# Patient Record
Sex: Male | Born: 1995 | Race: White | Hispanic: No | Marital: Single | Smoking: Never smoker
Health system: Southern US, Community
[De-identification: ages and names within clinical notes are randomized; demographics above are authoritative.]

## PROBLEM LIST (undated history)

## (undated) HISTORY — PX: CYST EXCISION: SHX5701

---

## 2014-06-14 ENCOUNTER — Encounter: Payer: Self-pay | Admitting: General Practice

## 2014-06-14 ENCOUNTER — Emergency Department: Payer: PRIVATE HEALTH INSURANCE

## 2014-06-14 ENCOUNTER — Emergency Department
Admission: EM | Admit: 2014-06-14 | Discharge: 2014-06-14 | Disposition: A | Discharge status: Home / Self Care | Payer: PRIVATE HEALTH INSURANCE | Attending: Emergency Medicine | Admitting: Emergency Medicine

## 2014-06-14 DIAGNOSIS — R51 Headache: Secondary | ICD-10-CM | POA: Diagnosis present

## 2014-06-14 DIAGNOSIS — G8929 Other chronic pain: Secondary | ICD-10-CM | POA: Diagnosis not present

## 2014-06-14 DIAGNOSIS — R42 Dizziness and giddiness: Secondary | ICD-10-CM | POA: Insufficient documentation

## 2014-06-14 MED ORDER — HYDROCODONE-ACETAMINOPHEN 5-325 MG PO TABS: 1.0000 | ORAL_TABLET | ORAL | Status: AC | PRN

## 2014-06-14 NOTE — Discharge Instructions (Signed)
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   Also for follow up with Chronic headaches
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
## 2014-06-14 NOTE — ED Notes (Signed)
States he is having intermittent headaches for a while  Denies any trauma fever vision changes or trauma
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
## 2021-03-31 ENCOUNTER — Ambulatory Visit
Admission: EM | Admit: 2021-03-31 | Discharge: 2021-03-31 | Disposition: A | Discharge status: Home / Self Care | Payer: Managed Care, Other (non HMO) | Attending: Internal Medicine | Admitting: Internal Medicine

## 2021-03-31 ENCOUNTER — Other Ambulatory Visit: Payer: Self-pay

## 2021-03-31 ENCOUNTER — Encounter: Payer: Self-pay | Admitting: Emergency Medicine

## 2021-03-31 DIAGNOSIS — J039 Acute tonsillitis, unspecified: Secondary | ICD-10-CM | POA: Diagnosis not present

## 2021-03-31 DIAGNOSIS — J029 Acute pharyngitis, unspecified: Secondary | ICD-10-CM

## 2021-03-31 MED ORDER — AMOXICILLIN-POT CLAVULANATE 875-125 MG PO TABS: 1.0000 | ORAL_TABLET | Freq: Two times a day (BID) | ORAL | 0 refills | Status: AC

## 2021-03-31 MED ORDER — PREDNISONE 10 MG (21) PO TBPK: ORAL_TABLET | Freq: Every day | ORAL | 0 refills | Status: AC

## 2021-03-31 NOTE — ED Triage Notes (Addendum)
Says he had strep in November, then he's gotten sick with a sore throat every week since then. States it'll make him hoarse. This episode started two days ago ?

## 2021-03-31 NOTE — ED Provider Notes (Signed)
?[facility] ? ? ? ?CSN: [id number] ?Arrival date & time: 03/31/21  1718 ? ? ?  ? ?History   ?Chief Complaint ?Chief Complaint  ?Patient presents with  ? Sore Throat  ? ? ?HPI ?Kevin Camacho is a 26 y.o. male.  ? ?Patient presents with sore throat that started approximately 2 to 3 days ago.  Patient reports that he had strep throat in November 2022 and has been getting upper respiratory infections with associated sore throat intermittently since this occurred.  They have self resolved until today.  He does report that he feels like he has something in the back of his throat and has had a change in voice given the sensation.  Patient is able to swallow his spit, food, liquids.  Denies any associated fever or shortness of breath. ? ? ?Sore Throat ? ? ?History reviewed. No pertinent past medical history. ? ?There are no problems to display for this patient. ? ? ?Past Surgical History:  ?Procedure Laterality Date  ? CYST EXCISION    ? ? ? ? ? ?Home Medications   ? ?Prior to Admission medications   ?Medication Sig Start Date End Date Taking? Authorizing Provider  ?amoxicillin-clavulanate (AUGMENTIN) 875-125 MG tablet Take 1 tablet by mouth every 12 (twelve) hours. 03/31/21  Yes Teodora Medici, FNP  ?predniSONE (STERAPRED UNI-PAK 21 TAB) 10 MG (21) TBPK tablet Take by mouth daily. Take 6 tabs by mouth daily  for 2 days, then 5 tabs for 2 days, then 4 tabs for 2 days, then 3 tabs for 2 days, 2 tabs for 2 days, then 1 tab by mouth daily for 2 days 03/31/21  Yes Teodora Medici, FNP  ?HYDROcodone-acetaminophen (NORCO/VICODIN) 5-325 MG per tablet Take 1 tablet by mouth every 4 (four) hours as needed for moderate pain. 06/14/14   Johnn Hai, PA-C  ? ? ?Family History ?History reviewed. No pertinent family history. ? ?Social History ?Social History  ? ?Tobacco Use  ? Smoking status: Never  ? Smokeless tobacco: Never  ?Substance Use Topics  ? Alcohol use: No  ? Drug use: No  ? ? ? ?Allergies   ?Patient has no known  allergies. ? ? ?Review of Systems ?Review of Systems ?Per HPI ? ?Physical Exam ?Triage Vital Signs ?ED Triage Vitals [03/31/21 1803]  ?Enc Vitals Group  ?   BP 135/79  ?   Pulse Rate 66  ?   Resp 16  ?   Temp 98.1 ?F (36.7 ?C)  ?   Temp Source Oral  ?   SpO2 95 %  ?   Weight   ?   Height   ?   Head Circumference   ?   Peak Flow   ?   Pain Score 2  ?   Pain Loc   ?   Pain Edu?   ?   Excl. in [place]?   ? ?No data found. ? ?Updated Vital Signs ?BP 135/79 (BP Location: Left Arm)   Pulse 66   Temp 98.1 ?F (36.7 ?C) (Oral)   Resp 16   SpO2 95%  ? ?Visual Acuity ?Right Eye Distance:   ?Left Eye Distance:   ?Bilateral Distance:   ? ?Right Eye Near:   ?Left Eye Near:    ?Bilateral Near:    ? ?Physical Exam ?Constitutional:   ?   General: He is not in acute distress. ?   Appearance: Normal appearance. He is not toxic-appearing or diaphoretic.  ?HENT:  ?  Head: Normocephalic and atraumatic.  ?   Mouth/Throat:  ?   Pharynx: Uvula swelling present. No oropharyngeal exudate or posterior oropharyngeal erythema.  ?   Tonsils: No tonsillar exudate. 3+ on the right. 3+ on the left.  ?Eyes:  ?   Extraocular Movements: Extraocular movements intact.  ?   Conjunctiva/sclera: Conjunctivae normal.  ?Cardiovascular:  ?   Rate and Rhythm: Normal rate and regular rhythm.  ?   Pulses: Normal pulses.  ?   Heart sounds: Normal heart sounds.  ?Pulmonary:  ?   Effort: Pulmonary effort is normal. No respiratory distress.  ?   Breath sounds: Normal breath sounds.  ?Neurological:  ?   General: No focal deficit present.  ?   Mental Status: He is alert and oriented to person, place, and time. Mental status is at baseline.  ?Psychiatric:     ?   Mood and Affect: Mood normal.     ?   Behavior: Behavior normal.     ?   Thought Content: Thought content normal.     ?   Judgment: Judgment normal.  ? ? ? ?UC Treatments / Results  ?Labs ?(all labs ordered are listed, but only abnormal results are displayed) ?Labs Reviewed - No data to  display ? ?EKG ? ? ?Radiology ?No results found. ? ?Procedures ?Procedures (including critical care time) ? ?Medications Ordered in UC ?Medications - No data to display ? ?Initial Impression / Assessment and Plan / UC Course  ?I have reviewed the triage vital signs and the nursing notes. ? ?Pertinent labs & imaging results that were available during my care of the patient were reviewed by me and considered in my medical decision making (see chart for details). ? ?  ? ?Patient has severe acute tonsillitis.  Suggested the patient get a CT scan to rule out peritonsillar abscess but patient declined.  Do not have CT capabilities in urgent care.  Risks associated with not getting CT scan were discussed with patient.  Patient voiced understanding.  Will prescribe prednisone steroid as well as a course of Augmentin antibiotic to cover for infection and to treat acute tonsillitis.  Patient is still able to swallow spit and control secretions so he is stable at this time.  Patient will need to follow-up with ear, nose, throat specialist at provided contact info as soon as possible for further evaluation and management given chronicity of issues and size of tonsils.  Discussed return precautions and ER precautions.  Patient verbalized understanding and was agreeable with plan. ?Final Clinical Impressions(s) / UC Diagnoses  ? ?Final diagnoses:  ?Acute tonsillitis, unspecified etiology  ?Sore throat  ? ? ? ?Discharge Instructions   ? ?  ?You have been prescribed an antibiotic and a steroid to decrease the size of your tonsils.  Please follow-up with ear, nose, throat specialist for further evaluation and management. ? ? ? ?ED Prescriptions   ? ? Medication Sig Dispense Auth. Provider  ? amoxicillin-clavulanate (AUGMENTIN) 875-125 MG tablet Take 1 tablet by mouth every 12 (twelve) hours. 14 tablet Teodora Medici, [place]  ? predniSONE (STERAPRED UNI-PAK 21 TAB) 10 MG (21) TBPK tablet Take by mouth daily. Take 6 tabs by mouth daily   for 2 days, then 5 tabs for 2 days, then 4 tabs for 2 days, then 3 tabs for 2 days, 2 tabs for 2 days, then 1 tab by mouth daily for 2 days 42 tablet Teodora Medici,   ? ?  ? ?PDMP not  reviewed this encounter. ?  ?Teodora Medici, [place] ?03/31/21 1823 ? ?

## 2021-03-31 NOTE — Discharge Instructions (Signed)
You have been prescribed an antibiotic and a steroid to decrease the size of your tonsils.  Please follow-up with ear, nose, throat specialist for further evaluation and management. ?
